# Patient Record
Sex: Male | Born: 2000 | Race: White | Hispanic: No | Marital: Single | Smoking: Never smoker
Health system: Southern US, Community
[De-identification: ages and names within clinical notes are randomized; demographics above are authoritative.]

## PROBLEM LIST (undated history)

## (undated) HISTORY — PX: FRACTURE SURGERY: SHX138

## (undated) HISTORY — PX: DENTAL SURGERY: SHX609

---
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
## 2022-04-30 IMAGING — CR DG HAND COMPLETE 3+V*R*
1 series · 3 of 3 positions shown · non-contrast
Comparison: 07/18/2019

CLINICAL DATA: Fell 1 week ago.  Previous fractures with pinning.

EXAM:
RIGHT HAND - COMPLETE 3+ VIEW

[Series 1: dg hand complete right · 0.14mm/px · 3 of 3 slices shown]
[im 1/3]
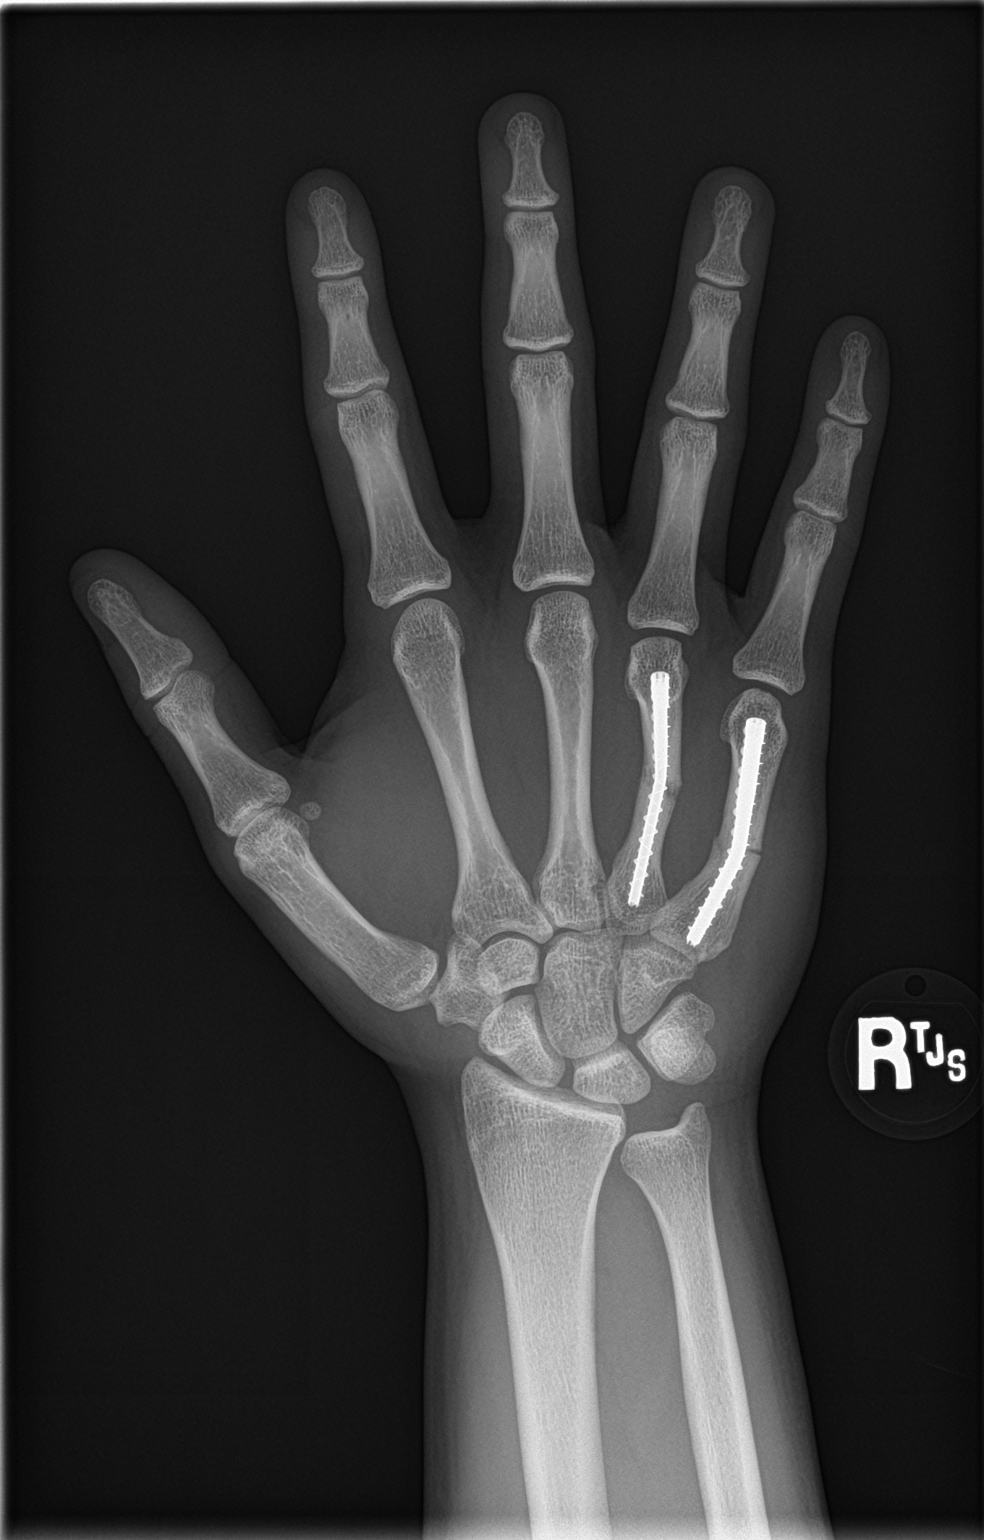
[im 2/3]
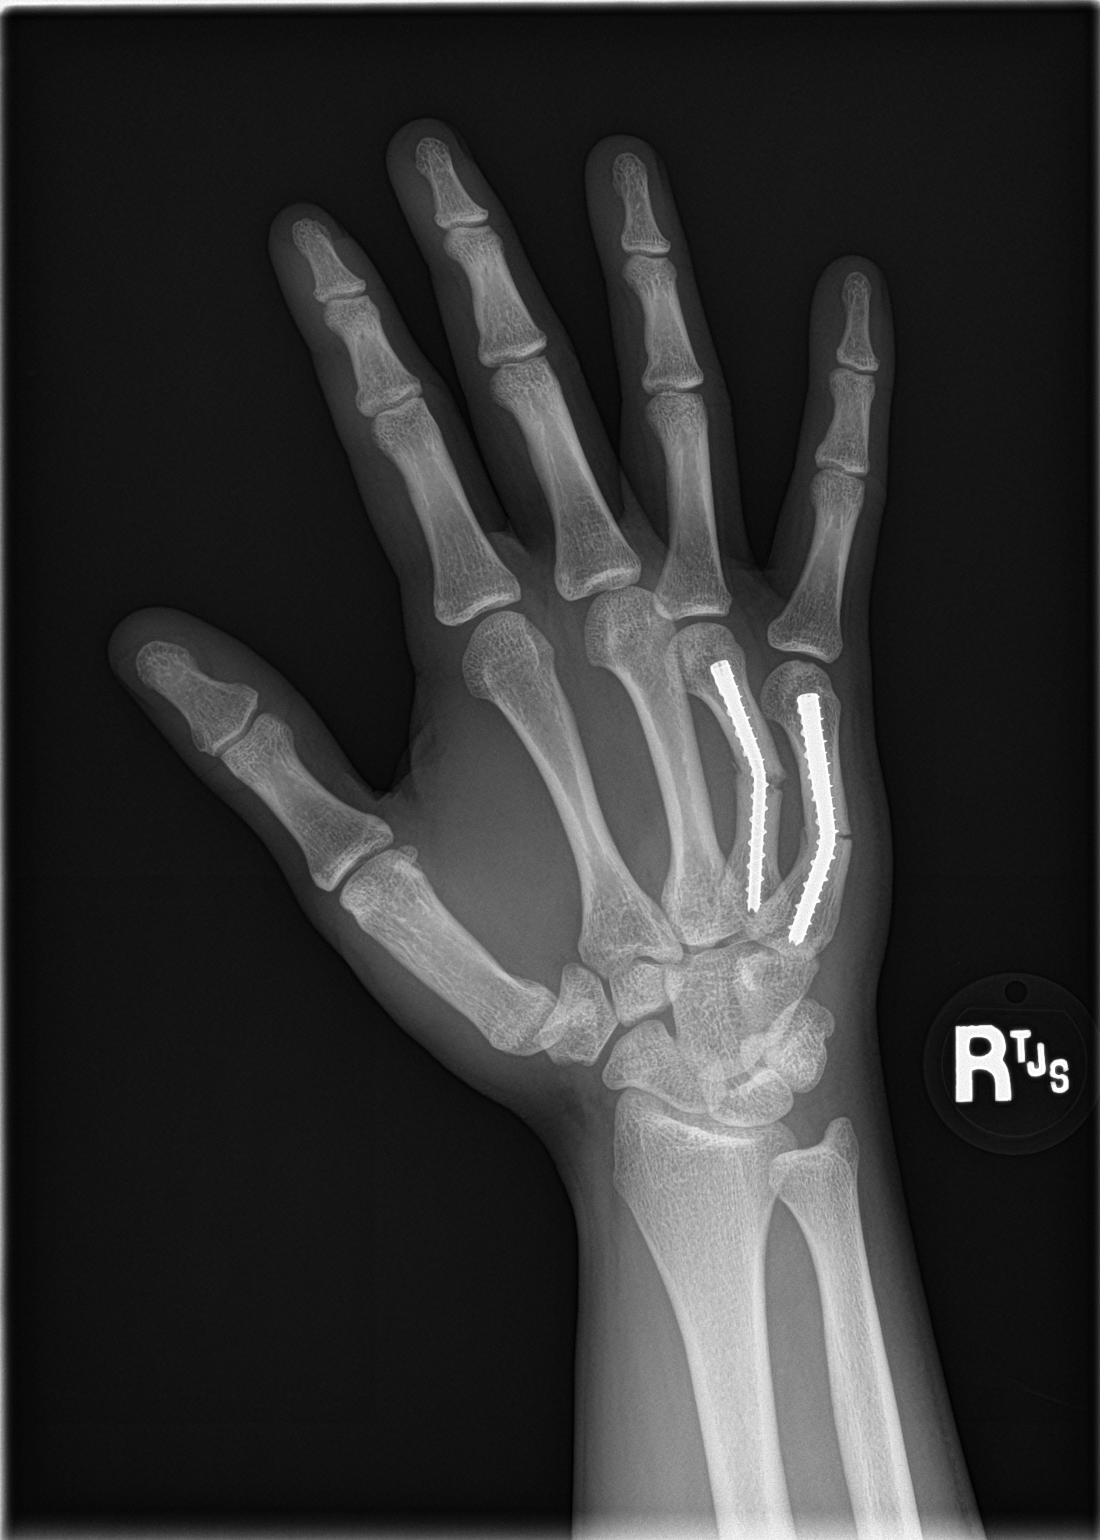
[im 3/3]
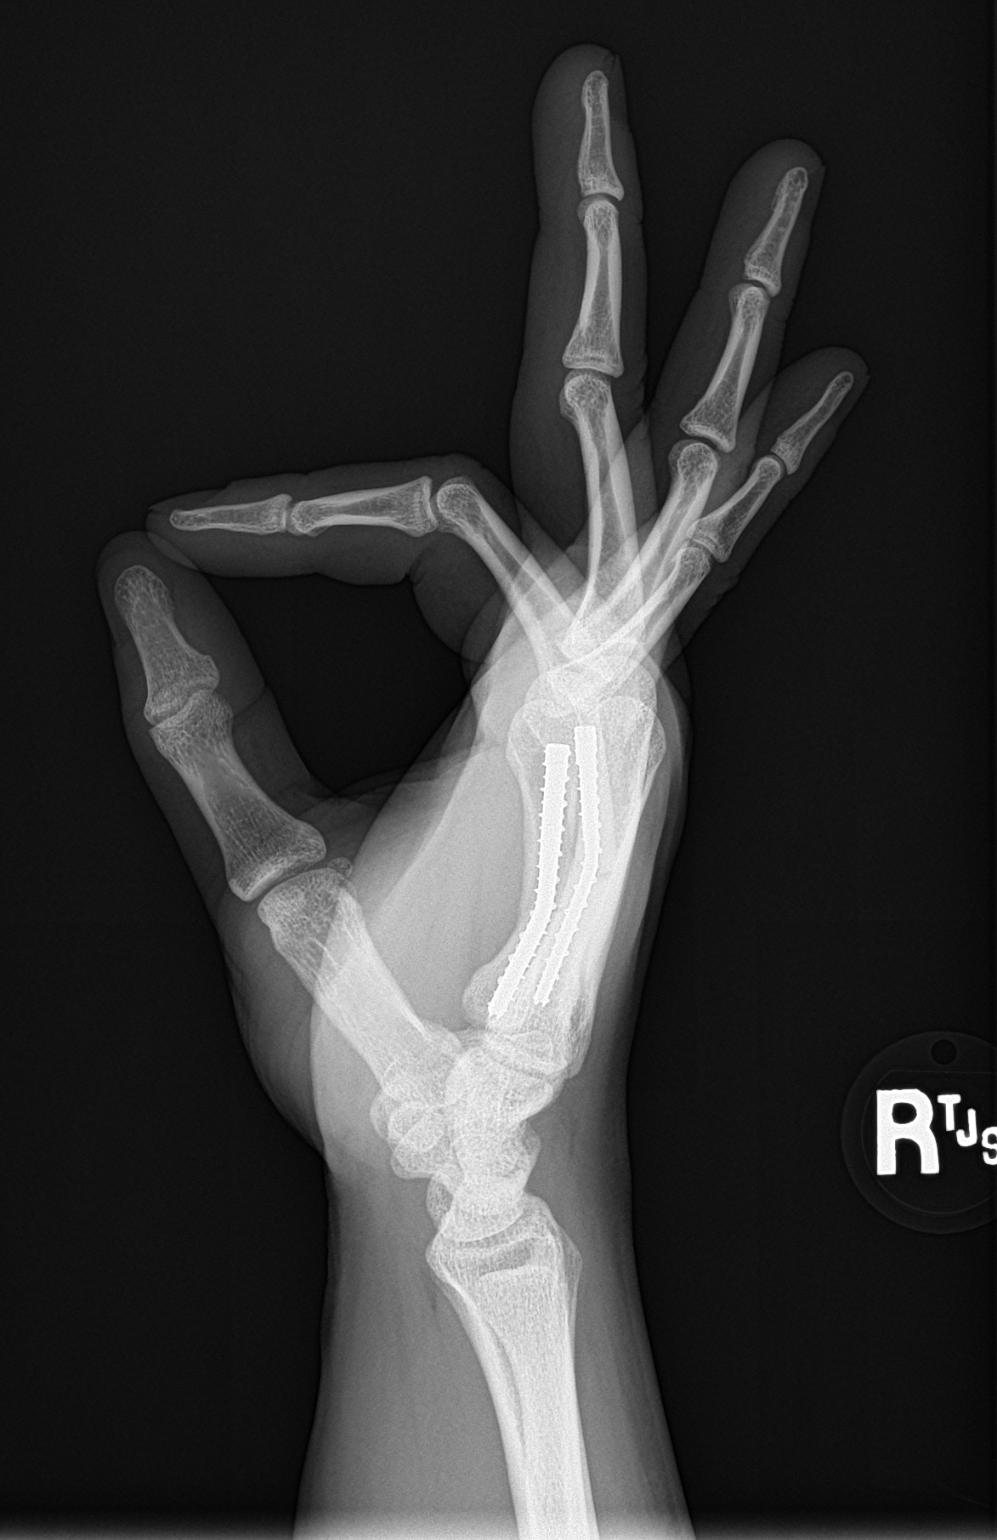

[3 of 3 positions shown; findings below may reference images not displayed]

FINDINGS: Previously placed intramedullary screws at the previously fractured
metacarpals of the ring and small fingers. There is bending of both
screws which must relate to the recent trauma. The screws are not
frankly broken. Recurrent fracture lines visible of the ring finger
and small finger metacarpals at the same locations as the previous
fractures.
IMPRESSION: Recurrent fracture lines visible of the ring and small finger
metacarpals at the same locations as the previous fractures. There
is bending of both screws which must relate to the recent trauma.

## 2023-12-14 ENCOUNTER — Ambulatory Visit
Admission: EM | Admit: 2023-12-14 | Discharge: 2023-12-14 | Disposition: A | Discharge status: Home / Self Care | Payer: Self-pay | Attending: Family Medicine | Admitting: Family Medicine

## 2023-12-14 DIAGNOSIS — J029 Acute pharyngitis, unspecified: Secondary | ICD-10-CM | POA: Insufficient documentation

## 2023-12-14 LAB — POCT RAPID STREP A (OFFICE): Rapid Strep A Screen: NEGATIVE

## 2023-12-14 NOTE — ED Provider Notes (Addendum)
 Robert Wood    CSN: [id number] Arrival date & time: 12/14/23  6283      History   Chief Complaint No chief complaint on file.   HPI Robert Wood is a 23 y.o. male  presents for evaluation of URI symptoms for 1 days. Patient reports associated symptoms of throat and headache that started this morning. Denies N/V/D, cough, congestion, ear pain, body aches, shortness of breath. Patient does not have a hx of asthma. Patient is an active smoker.   Reports no sick contacts.  Pt has taken nothing OTC for symptoms. Pt has no other concerns at this time.   HPI  History reviewed. No pertinent past medical history.  There are no active problems to display for this patient.   Past Surgical History:  Procedure Laterality Date   DENTAL SURGERY     FRACTURE SURGERY         Home Medications    Prior to Admission medications   Medication Sig Start Date End Date Taking? Authorizing Provider  Oxcarbazepine (TRILEPTAL) 300 MG tablet Take 300 mg by mouth 2 (two) times daily. 05/31/20   [provider]    Family History History reviewed. No pertinent family history.  Social History Social History   Tobacco Use   Smoking status: Never   Smokeless tobacco: Never  Vaping Use   Vaping status: Every Day  Substance Use Topics   Alcohol use: Yes   Drug use: Yes    Types: Marijuana     Allergies   Patient has no known allergies.   Review of Systems Review of Systems  HENT:  Positive for sore throat.   Neurological:  Positive for headaches.     Physical Exam Triage Vital Signs ED Triage Vitals  Encounter Vitals Group     BP 12/14/23 1014 107/63     Girls Systolic BP Percentile --      Girls Diastolic BP Percentile --      Boys Systolic BP Percentile --      Boys Diastolic BP Percentile --      Pulse Rate 12/14/23 1014 64     Resp 12/14/23 1014 16     Temp 12/14/23 1014 98.1 F (36.7 C)     Temp Source 12/14/23 1014 Oral     SpO2 12/14/23 1014 98 %      Weight --      Height --      Head Circumference --      Peak Flow --      Pain Score 12/14/23 1011 7     Pain Loc --      Pain Education --      Exclude from Growth Chart --    No data found.  Updated Vital Signs BP 107/63 (BP Location: Right Arm)   Pulse 64   Temp 98.1 F (36.7 C) (Oral)   Resp 16   SpO2 98%   Visual Acuity Right Eye Distance:   Left Eye Distance:   Bilateral Distance:    Right Eye Near:   Left Eye Near:    Bilateral Near:     Physical Exam Vitals and nursing note reviewed.  Constitutional:      General: He is not in acute distress.    Appearance: Normal appearance. He is not ill-appearing or toxic-appearing.  HENT:     Head: Normocephalic and atraumatic.     Right Ear: Tympanic membrane and ear canal normal.     Left Ear: Tympanic membrane  and ear canal normal.     Nose: No congestion.     Mouth/Throat:     Mouth: Mucous membranes are moist.     Pharynx: Posterior oropharyngeal erythema present.   Eyes:     Pupils: Pupils are equal, round, and reactive to light.    Cardiovascular:     Rate and Rhythm: Normal rate and regular rhythm.     Heart sounds: Normal heart sounds.  Pulmonary:     Effort: Pulmonary effort is normal.     Breath sounds: Normal breath sounds.   Musculoskeletal:     Cervical back: Normal range of motion and neck supple.  Lymphadenopathy:     Cervical: No cervical adenopathy.   Skin:    General: Skin is warm and dry.   Neurological:     General: No focal deficit present.     Mental Status: He is alert and oriented to person, place, and time.   Psychiatric:        Mood and Affect: Mood normal.        Behavior: Behavior normal.      UC Treatments / Results  Labs (all labs ordered are listed, but only abnormal results are displayed) Labs Reviewed  POCT RAPID STREP A (OFFICE) - Normal  CULTURE, GROUP A STREP [facility])    EKG   Radiology No results found.  Procedures Procedures (including  critical care time)  Medications Ordered in UC Medications - No data to display  Initial Impression / Assessment and Plan / UC Course  I have reviewed the triage vital signs and the nursing notes.  Pertinent labs & imaging results that were available during my care of the patient were reviewed by me and considered in my medical decision making (see chart for details).     Reviewed exam and symptoms with patient.  No red flags.  Negative rapid strep.  Will send strep throat culture.  Patient declined COVID or flu testing.  Discussed viral illness and symptomatic treatment.  PCP follow-up if symptoms do not improve.  ER precautions reviewed. Final Clinical Impressions(s) / UC Diagnoses   Final diagnoses:  Viral pharyngitis  Sore throat     Discharge Instructions      Please treat your symptoms with over the counter cough medication, tylenol  or ibuprofen, humidifier, and rest. Viral illnesses can last 7-14 days. Please follow up with your PCP if your symptoms are not improving. Please go to the ER for any worsening symptoms. This includes but is not limited to fever you can not control with tylenol  or ibuprofen, you are not able to stay hydrated, you have shortness of breath or chest pain.  Thank you for choosing [facility] for your healthcare needs. I hope you feel better soon!      ED Prescriptions   None    PDMP not reviewed this encounter.   Alleen Arbour, NP 12/14/23 1042    Alleen Arbour, NP 12/14/23 1045

## 2023-12-14 NOTE — ED Triage Notes (Signed)
 Pt presents to UC for c/o sore throat, difficulty swallowing since this morning. Reports headache.

## 2023-12-14 NOTE — Discharge Instructions (Signed)

## 2023-12-16 ENCOUNTER — Ambulatory Visit (HOSPITAL_COMMUNITY): Payer: Self-pay

## 2023-12-16 LAB — CULTURE, GROUP A STREP (THRC)

## 2024-06-23 ENCOUNTER — Emergency Department (HOSPITAL_COMMUNITY)

## 2024-06-23 ENCOUNTER — Emergency Department (HOSPITAL_COMMUNITY)
Admission: EM | Admit: 2024-06-23 | Discharge: 2024-06-23 | Disposition: A | Discharge status: Home / Self Care | Attending: Emergency Medicine | Admitting: Emergency Medicine

## 2024-06-23 ENCOUNTER — Other Ambulatory Visit: Payer: Self-pay

## 2024-06-23 ENCOUNTER — Emergency Department (HOSPITAL_COMMUNITY)
Admission: EM | Admit: 2024-06-23 | Discharge: 2024-06-23 | Discharge status: Absent without leave | Payer: Self-pay | Source: Home / Self Care | Attending: Emergency Medicine | Admitting: Emergency Medicine

## 2024-06-23 ENCOUNTER — Encounter (HOSPITAL_COMMUNITY): Payer: Self-pay

## 2024-06-23 DIAGNOSIS — E119 Type 2 diabetes mellitus without complications: Secondary | ICD-10-CM | POA: Diagnosis not present

## 2024-06-23 DIAGNOSIS — M25511 Pain in right shoulder: Secondary | ICD-10-CM | POA: Diagnosis not present

## 2024-06-23 DIAGNOSIS — M542 Cervicalgia: Secondary | ICD-10-CM | POA: Diagnosis not present

## 2024-06-23 DIAGNOSIS — Y92410 Unspecified street and highway as the place of occurrence of the external cause: Secondary | ICD-10-CM | POA: Diagnosis not present

## 2024-06-23 DIAGNOSIS — S3011XA Contusion of abdominal wall, initial encounter: Secondary | ICD-10-CM | POA: Diagnosis not present

## 2024-06-23 DIAGNOSIS — M79641 Pain in right hand: Secondary | ICD-10-CM | POA: Insufficient documentation

## 2024-06-23 DIAGNOSIS — M79642 Pain in left hand: Secondary | ICD-10-CM | POA: Diagnosis not present

## 2024-06-23 DIAGNOSIS — S62512A Displaced fracture of proximal phalanx of left thumb, initial encounter for closed fracture: Secondary | ICD-10-CM

## 2024-06-23 DIAGNOSIS — S3991XA Unspecified injury of abdomen, initial encounter: Secondary | ICD-10-CM | POA: Diagnosis present

## 2024-06-23 DIAGNOSIS — S43101A Unspecified dislocation of right acromioclavicular joint, initial encounter: Secondary | ICD-10-CM

## 2024-06-23 LAB — CBC
HCT: 44.6 % (ref 39.0–52.0)
Hemoglobin: 15.6 g/dL (ref 13.0–17.0)
MCH: 30 pg (ref 26.0–34.0)
MCHC: 35 g/dL (ref 30.0–36.0)
MCV: 85.8 fL (ref 80.0–100.0)
Platelets: 310 K/uL (ref 150–400)
RBC: 5.2 MIL/uL (ref 4.22–5.81)
RDW: 12.2 % (ref 11.5–15.5)
WBC: 19.4 K/uL — ABNORMAL HIGH (ref 4.0–10.5)
nRBC: 0 % (ref 0.0–0.2)

## 2024-06-23 LAB — I-STAT CHEM 8, ED
BUN: 17 mg/dL (ref 6–20)
Calcium, Ion: 1.19 mmol/L (ref 1.15–1.40)
Chloride: 107 mmol/L (ref 98–111)
Creatinine, Ser: 1.2 mg/dL (ref 0.61–1.24)
Glucose, Bld: 100 mg/dL — ABNORMAL HIGH (ref 70–99)
HCT: 45 % (ref 39.0–52.0)
Hemoglobin: 15.3 g/dL (ref 13.0–17.0)
Potassium: 3.5 mmol/L (ref 3.5–5.1)
Sodium: 142 mmol/L (ref 135–145)
TCO2: 23 mmol/L (ref 22–32)

## 2024-06-23 LAB — COMPREHENSIVE METABOLIC PANEL WITH GFR
ALT: 29 U/L (ref 0–44)
AST: 31 U/L (ref 15–41)
Albumin: 4.8 g/dL (ref 3.5–5.0)
Alkaline Phosphatase: 80 U/L (ref 38–126)
Anion gap: 12 (ref 5–15)
BUN: 17 mg/dL (ref 6–20)
CO2: 22 mmol/L (ref 22–32)
Calcium: 9.8 mg/dL (ref 8.9–10.3)
Chloride: 105 mmol/L (ref 98–111)
Creatinine, Ser: 1.08 mg/dL (ref 0.61–1.24)
GFR, Estimated: >60 mL/min
Glucose, Bld: 95 mg/dL (ref 70–99)
Potassium: 3.6 mmol/L (ref 3.5–5.1)
Sodium: 139 mmol/L (ref 135–145)
Total Bilirubin: 0.6 mg/dL (ref 0.0–1.2)
Total Protein: 7.9 g/dL (ref 6.5–8.1)

## 2024-06-23 LAB — ETHANOL: Alcohol, Ethyl (B): <15 mg/dL

## 2024-06-23 LAB — PROTIME-INR
INR: 1 (ref 0.8–1.2)
Prothrombin Time: 13.3 s (ref 11.4–15.2)

## 2024-06-23 LAB — I-STAT CG4 LACTIC ACID, ED: Lactic Acid, Venous: 0.7 mmol/L (ref 0.5–1.9)

## 2024-06-23 LAB — SAMPLE TO BLOOD BANK

## 2024-06-23 MED ORDER — FENTANYL CITRATE (PF) 50 MCG/ML IJ SOSY
50.0000 ug | PREFILLED_SYRINGE | Freq: Once | INTRAMUSCULAR | Status: AC
  Administered 2024-06-23: 50 ug via INTRAVENOUS
  Filled 2024-06-23: qty 1

## 2024-06-23 MED ORDER — METHOCARBAMOL 500 MG PO TABS: 500.0000 mg | ORAL_TABLET | Freq: Two times a day (BID) | ORAL | 0 refills | Status: AC

## 2024-06-23 MED ORDER — METHOCARBAMOL 500 MG PO TABS
500.0000 mg | ORAL_TABLET | Freq: Once | ORAL | Status: AC
  Administered 2024-06-23: 500 mg via ORAL
  Filled 2024-06-23: qty 1

## 2024-06-23 MED ORDER — IOHEXOL 350 MG/ML SOLN
75.0000 mL | Freq: Once | INTRAVENOUS | Status: AC | PRN
  Administered 2024-06-23: 75 mL via INTRAVENOUS

## 2024-06-23 MED ORDER — KETOROLAC TROMETHAMINE 15 MG/ML IJ SOLN
15.0000 mg | Freq: Once | INTRAMUSCULAR | Status: AC
  Administered 2024-06-23: 15 mg via INTRAVENOUS
  Filled 2024-06-23: qty 1

## 2024-06-23 MED ORDER — LACTATED RINGERS IV BOLUS
500.0000 mL | Freq: Once | INTRAVENOUS | Status: AC
  Administered 2024-06-23: 500 mL via INTRAVENOUS
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
## 2024-06-23 NOTE — ED Notes (Signed)
 Assumed care of patient at this time
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
## 2024-06-23 NOTE — Discharge Instructions (Signed)
 You were seen today for [facility]. While you were here we monitored your vitals, preformed a physical exam, and labs and imaging. These were all reassuring and there is no indication for any further testing or intervention in the emergency department at this time.   Things to do:  - Follow up with your primary care provider within the next 1-2 weeks - please follow-up with Orthopedics and Hand surgery  Return to the emergency department if you have any new or worsening symptoms including worsening pain, difficulty ambulating, headaches or visual changes, chest pain, or if you have any other concerns.
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
## 2024-06-23 NOTE — ED Notes (Addendum)
 Patient initially triaged under wrong patient by other RN, registration information incorrect. When this RN went to patients room to assess patient was confirming patients identification and patient notified this RN that information was not correct. MD notified, CN notified & registration notified.

## 2024-06-23 NOTE — ED Notes (Signed)
 Rounded on patient, patient remains off the floor in radiology. Family at bedside, updated family of patients condition.

## 2024-06-23 NOTE — ED Notes (Signed)
 Patient transported to X-ray
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
## 2024-07-19 ENCOUNTER — Ambulatory Visit (INDEPENDENT_AMBULATORY_CARE_PROVIDER_SITE_OTHER): Payer: Self-pay | Admitting: Orthopaedic Surgery

## 2024-07-19 ENCOUNTER — Other Ambulatory Visit (INDEPENDENT_AMBULATORY_CARE_PROVIDER_SITE_OTHER): Payer: Self-pay

## 2024-07-19 DIAGNOSIS — S43101A Unspecified dislocation of right acromioclavicular joint, initial encounter: Secondary | ICD-10-CM
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
## 2024-08-30 ENCOUNTER — Ambulatory Visit: Payer: Self-pay | Admitting: Orthopaedic Surgery
